# Patient Record
Sex: Male | Born: 1995 | Race: White | Hispanic: No | Marital: Single | Smoking: Never smoker
Health system: Southern US, Community
[De-identification: ages and names within clinical notes are randomized; demographics above are authoritative.]

## PROBLEM LIST (undated history)

## (undated) DIAGNOSIS — J302 Other seasonal allergic rhinitis: Secondary | ICD-10-CM

## (undated) HISTORY — DX: Other seasonal allergic rhinitis: J30.2

## (undated) HISTORY — PX: MOUTH SURGERY: SHX715

---
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
## 2015-01-08 ENCOUNTER — Encounter (HOSPITAL_COMMUNITY): Payer: Self-pay | Admitting: Emergency Medicine

## 2015-01-08 ENCOUNTER — Emergency Department (HOSPITAL_COMMUNITY)
Admission: EM | Admit: 2015-01-08 | Discharge: 2015-01-08 | Disposition: A | Discharge status: Home / Self Care | Payer: Medicaid Other | Attending: Emergency Medicine | Admitting: Emergency Medicine

## 2015-01-08 DIAGNOSIS — Z7951 Long term (current) use of inhaled steroids: Secondary | ICD-10-CM | POA: Insufficient documentation

## 2015-01-08 DIAGNOSIS — R197 Diarrhea, unspecified: Secondary | ICD-10-CM | POA: Insufficient documentation

## 2015-01-08 DIAGNOSIS — Z7982 Long term (current) use of aspirin: Secondary | ICD-10-CM | POA: Insufficient documentation

## 2015-01-08 DIAGNOSIS — R1084 Generalized abdominal pain: Secondary | ICD-10-CM | POA: Insufficient documentation

## 2015-01-08 DIAGNOSIS — R509 Fever, unspecified: Secondary | ICD-10-CM | POA: Insufficient documentation

## 2015-01-08 DIAGNOSIS — R110 Nausea: Secondary | ICD-10-CM | POA: Insufficient documentation

## 2015-01-08 LAB — CBC WITH DIFFERENTIAL/PLATELET
Basophils Absolute: 0 10*3/uL (ref 0.0–0.1)
Basophils Relative: 0 % (ref 0–1)
EOS ABS: 0 10*3/uL (ref 0.0–0.7)
Eosinophils Relative: 0 % (ref 0–5)
HEMATOCRIT: 43.5 % (ref 39.0–52.0)
HEMOGLOBIN: 14.8 g/dL (ref 13.0–17.0)
Lymphocytes Relative: 5 % — ABNORMAL LOW (ref 12–46)
Lymphs Abs: 0.5 10*3/uL — ABNORMAL LOW (ref 0.7–4.0)
MCH: 30.5 pg (ref 26.0–34.0)
MCHC: 34 g/dL (ref 30.0–36.0)
MCV: 89.5 fL (ref 78.0–100.0)
MONOS PCT: 10 % (ref 3–12)
Monocytes Absolute: 0.9 10*3/uL (ref 0.1–1.0)
NEUTROS PCT: 85 % — AB (ref 43–77)
Neutro Abs: 7.3 10*3/uL (ref 1.7–7.7)
Platelets: 214 10*3/uL (ref 150–400)
RBC: 4.86 MIL/uL (ref 4.22–5.81)
RDW: 12.7 % (ref 11.5–15.5)
WBC: 8.7 10*3/uL (ref 4.0–10.5)

## 2015-01-08 LAB — I-STAT CHEM 8, ED
BUN: 14 mg/dL (ref 6–20)
CHLORIDE: 105 mmol/L (ref 101–111)
Calcium, Ion: 1.12 mmol/L (ref 1.12–1.23)
Creatinine, Ser: 0.7 mg/dL (ref 0.61–1.24)
GLUCOSE: 102 mg/dL — AB (ref 65–99)
HCT: 47 % (ref 39.0–52.0)
Hemoglobin: 16 g/dL (ref 13.0–17.0)
POTASSIUM: 3.4 mmol/L — AB (ref 3.5–5.1)
Sodium: 138 mmol/L (ref 135–145)
TCO2: 20 mmol/L (ref 0–100)

## 2015-01-08 MED ORDER — SODIUM CHLORIDE 0.9 % IV BOLUS (SEPSIS)
1000.0000 mL | Freq: Once | INTRAVENOUS | Status: AC
  Administered 2015-01-08: 1000 mL via INTRAVENOUS

## 2015-01-08 MED ORDER — LOPERAMIDE HCL 2 MG PO CAPS: 2.0000 mg | ORAL_CAPSULE | Freq: Four times a day (QID) | ORAL | Status: AC | PRN

## 2015-01-08 MED ORDER — KETOROLAC TROMETHAMINE 30 MG/ML IJ SOLN
30.0000 mg | Freq: Once | INTRAMUSCULAR | Status: AC
  Administered 2015-01-08: 30 mg via INTRAVENOUS
  Filled 2015-01-08: qty 1

## 2015-01-08 MED ORDER — LOPERAMIDE HCL 2 MG PO CAPS
4.0000 mg | ORAL_CAPSULE | ORAL | Status: DC | PRN
  Administered 2015-01-08: 4 mg via ORAL
  Filled 2015-01-08: qty 2

## 2015-01-08 NOTE — ED Notes (Signed)
Bed: AO13 Expected date:  Expected time:  Means of arrival:  Comments: EMS 19 yo male left lower abdominal pain and chills x 1 day

## 2015-01-08 NOTE — ED Provider Notes (Signed)
Assumed care of patient from Earley Favor, NP, at change of shift.  Pt with diarrhea that began yesterday.  Only one episode in ED.  Pt receiving IVF and imodium.  May be d/c home after bolus.    6:56 AM Per nurse, pt is tolerating oral fluids, is asking for discharge home.  I have also spoken with the patient.  He has had no further diarrhea, states he is feeling better.  D/C with papers and prescription by NP Manus Rudd.   [place], PA-C 01/08/15 [phone]

## 2015-01-08 NOTE — ED Notes (Signed)
Pt arrived to the ED with a complaint of abdominal pain and diarrhea.  Pt states symptoms began earlier yesterday.  Pt states he has had 10-11 episodes of diarrhea.  Pt has been given 500 cc of fluid, and 4 mg of zofran by EMS.

## 2015-01-08 NOTE — ED Provider Notes (Signed)
CSN: [id number]     Arrival date & time 01/08/15  0007 History   First MD Initiated Contact with Patient 01/08/15 0012     Chief Complaint  Patient presents with  . Abdominal Pain  . Diarrhea     (Consider location/radiation/quality/duration/timing/severity/associated sxs/prior Treatment) HPI Comments: Patient states that at work last night he developed crampy abdominal pain followed by diarrhea and some nausea no vomiting .  He's had multiple episodes of diarrhea since that time .  He states he developed cold, chills and then fever .  States she's been urinating .  No known ill contacts.  He's never had anything like this before , has not taken any medication for  his symptoms   Patient is a 19 y.o. male presenting with abdominal pain and diarrhea. The history is provided by the patient.  Abdominal Pain Pain location:  Generalized Pain quality: cramping   Pain severity:  Moderate Onset quality:  Gradual Duration:  8 hours Timing:  Intermittent Progression:  Unchanged Chronicity:  New Relieved by:  Nothing Worsened by:  Nothing tried Ineffective treatments:  None tried Associated symptoms: chills, diarrhea, fever and nausea   Associated symptoms: no constipation, no cough, no dysuria, no shortness of breath, no sore throat and no vomiting   Diarrhea:    Quality:  Watery   Number of occurrences:  10   Severity:  Moderate   Duration:  8 hours   Timing:  Intermittent   Progression:  Unchanged Diarrhea Associated symptoms: abdominal pain, chills and fever   Associated symptoms: no vomiting     Past Medical History  Diagnosis Date  . Allergic rhinitis, seasonal 02/22/2011  . Seasonal allergies   . Astigmatism    Past Surgical History  Procedure Laterality Date  . Tonsillectomy  2010  . Mouth surgery     History reviewed. No pertinent family history. Social History  Substance Use Topics  . Smoking status: Never Smoker   . Smokeless tobacco: Never Used  . Alcohol Use:  No    Review of Systems  Constitutional: Positive for fever and chills.  HENT: Negative for sore throat.   Respiratory: Negative for cough and shortness of breath.   Gastrointestinal: Positive for nausea, abdominal pain and diarrhea. Negative for vomiting and constipation.  Genitourinary: Negative for dysuria and flank pain.  All other systems reviewed and are negative.     Allergies  Cranberry and Grapefruit concentrate  Home Medications   Prior to Admission medications   Medication Sig Start Date End Date Taking? Authorizing Provider  aspirin 325 MG tablet Take 325 mg by mouth daily as needed for mild pain.   Yes Historical Provider, MD  chlorpheniramine (CHLOR-TRIMETON) 4 MG tablet Take 4 mg by mouth 2 (two) times daily as needed for allergies.   Yes Historical Provider, MD  ibuprofen (ADVIL,MOTRIN) 200 MG tablet Take 400 mg by mouth every 6 (six) hours as needed for moderate pain.   Yes Historical Provider, MD  cephALEXin (KEFLEX) 500 MG capsule Take 1 capsule (500 mg total) by mouth 4 (four) times daily. Patient not taking: Reported on 07/28/2014 11/06/13   Mellody Drown, PA-C  cetirizine (ZYRTEC) 10 MG tablet Take 1 tablet (10 mg total) by mouth daily. 02/22/11 02/22/12  Faylene Kurtz, MD  fluticasone (FLONASE) 50 MCG/ACT nasal spray Place 2 sprays into the nose daily. 02/22/11 02/22/12  Faylene Kurtz, MD  HYDROcodone-acetaminophen (NORCO/VICODIN) 5-325 MG per tablet Take 1 tablet by mouth every 4 (four) hours as needed for  moderate pain or severe pain. Patient not taking: Reported on 07/28/2014 11/06/13   Mellody Drown, PA-C  loperamide (IMODIUM) 2 MG capsule Take 1 capsule (2 mg total) by mouth 4 (four) times daily as needed for diarrhea or loose stools. 01/08/15   Earley Favor, NP  ondansetron (ZOFRAN ODT) 8 MG disintegrating tablet 8mg  ODT q4 hours prn nausea Patient not taking: Reported on 10/14/2014 09/13/14   Dahlia Client Muthersbaugh, PA-C   BP 110/54 mmHg  Pulse 89  Temp(Src) 99.6 F  (37.6 C) (Oral)  Resp 18  Ht 5\' 10"  (1.778 m)  Wt 160 lb (72.576 kg)  BMI 22.96 kg/m2  SpO2 98% Physical Exam  Constitutional: He is oriented to person, place, and time. He appears well-developed and well-nourished.  HENT:  Head: Normocephalic.  Eyes: Pupils are equal, round, and reactive to light.  Neck: Normal range of motion.  Cardiovascular: Normal rate and regular rhythm.   Pulmonary/Chest: Effort normal and breath sounds normal.  Abdominal: Soft. Bowel sounds are normal. He exhibits no distension. There is no tenderness.  Musculoskeletal: Normal range of motion.  Neurological: He is alert and oriented to person, place, and time.  Skin: Skin is warm. There is pallor.  Nursing note and vitals reviewed.   ED Course  Procedures (including critical care time) Labs Review Labs Reviewed  CBC WITH DIFFERENTIAL/PLATELET - Abnormal; Notable for the following:    Neutrophils Relative % 85 (*)    Lymphocytes Relative 5 (*)    Lymphs Abs 0.5 (*)    All other components within normal limits  I-STAT CHEM 8, ED - Abnormal; Notable for the following:    Potassium 3.4 (*)    Glucose, Bld 102 (*)    All other components within normal limits    Imaging Review No results found. I, Leslie Jester K, personally reviewed and evaluated these images and lab results as part of my medical decision-making.   EKG Interpretation None     Patient has had one bowel movement since arrival in the emergency room and states he still does not feel any better.  He is feeling very weak additional IV fluids, Imodium, and Toradol MDM   Final diagnoses:  Diarrhea        Earley Favor, NP 01/08/15 0532  Derwood Kaplan, MD 01/08/15 2208

## 2015-01-08 NOTE — Discharge Instructions (Signed)
Been given a prescription for Imodium to help control your loose stools.  You've also been given a diet to follow.  Try to rest, drink plenty of fluids, follow the dietary outline take Imodium as directed.  Follow-up with your primary care physician

## 2015-01-09 ENCOUNTER — Emergency Department (HOSPITAL_COMMUNITY)
Admission: EM | Admit: 2015-01-09 | Discharge: 2015-01-09 | Disposition: A | Discharge status: Home / Self Care | Payer: Medicaid Other | Attending: Emergency Medicine | Admitting: Emergency Medicine

## 2015-01-09 ENCOUNTER — Emergency Department (HOSPITAL_COMMUNITY): Payer: Medicaid Other

## 2015-01-09 ENCOUNTER — Encounter (HOSPITAL_COMMUNITY): Payer: Self-pay

## 2015-01-09 DIAGNOSIS — Z7982 Long term (current) use of aspirin: Secondary | ICD-10-CM | POA: Insufficient documentation

## 2015-01-09 DIAGNOSIS — R197 Diarrhea, unspecified: Secondary | ICD-10-CM | POA: Insufficient documentation

## 2015-01-09 DIAGNOSIS — R509 Fever, unspecified: Secondary | ICD-10-CM | POA: Insufficient documentation

## 2015-01-09 DIAGNOSIS — R110 Nausea: Secondary | ICD-10-CM | POA: Insufficient documentation

## 2015-01-09 DIAGNOSIS — Z8709 Personal history of other diseases of the respiratory system: Secondary | ICD-10-CM | POA: Insufficient documentation

## 2015-01-09 DIAGNOSIS — Z8669 Personal history of other diseases of the nervous system and sense organs: Secondary | ICD-10-CM | POA: Insufficient documentation

## 2015-01-09 DIAGNOSIS — R1013 Epigastric pain: Secondary | ICD-10-CM

## 2015-01-09 MED ORDER — ONDANSETRON HCL 4 MG PO TABS: 4.0000 mg | ORAL_TABLET | Freq: Four times a day (QID) | ORAL | Status: AC

## 2015-01-09 MED ORDER — ONDANSETRON HCL 4 MG/2ML IJ SOLN
4.0000 mg | Freq: Once | INTRAMUSCULAR | Status: AC
  Administered 2015-01-09: 4 mg via INTRAVENOUS
  Filled 2015-01-09: qty 2

## 2015-01-09 MED ORDER — SODIUM CHLORIDE 0.9 % IV BOLUS (SEPSIS)
1000.0000 mL | Freq: Once | INTRAVENOUS | Status: AC
  Administered 2015-01-09: 1000 mL via INTRAVENOUS

## 2015-01-09 MED ORDER — TRAMADOL HCL 50 MG PO TABS: 50.0000 mg | ORAL_TABLET | Freq: Four times a day (QID) | ORAL | Status: AC | PRN

## 2015-01-09 MED ORDER — MORPHINE SULFATE 4 MG/ML IJ SOLN
6.0000 mg | Freq: Once | INTRAMUSCULAR | Status: AC
  Administered 2015-01-09: 6 mg via INTRAVENOUS
  Filled 2015-01-09: qty 2

## 2015-01-09 MED ORDER — ACETAMINOPHEN 325 MG PO TABS
650.0000 mg | ORAL_TABLET | Freq: Once | ORAL | Status: AC
Start: 2015-01-09 — End: 2015-01-09
  Administered 2015-01-09: 650 mg via ORAL
  Filled 2015-01-09: qty 2

## 2015-01-09 NOTE — ED Notes (Signed)
MD at bedside. 

## 2015-01-09 NOTE — ED Notes (Signed)
Bed: [facility] Expected date:  Expected time:  Means of arrival:  Comments: EMS- 19yo M, abdominal pain

## 2015-01-09 NOTE — Progress Notes (Signed)
Pt's girlfriend returned a call stating that pt still with abdominal pain.  CM could hear pt in background.  CM requested to speak with pt He reports he has taken zofran and Ultram but only sips of fluids and has not complied to liquid diet progressing to brat diet  Encouraged to follow discharge instructions clear liquids and gas releasing OTC and return to ED if no relief after consulting with ED staff who saw pt while at [facility]

## 2015-01-09 NOTE — ED Notes (Signed)
Asked patient if he could urinate for a sample and he said he could not.

## 2015-01-09 NOTE — Discharge Instructions (Signed)

## 2015-01-09 NOTE — ED Notes (Signed)
Per [facility]- Pt onset Saturday. Seen and TX at [facility]. Released- given for diarrhea however has not had filled yet. Pt felt relief and increased his diet to fatty and greasy food. Pt here for presentting complaint of generalized BUQ pain and nausea. Denies any other complaints

## 2015-01-16 NOTE — ED Provider Notes (Signed)
CSN: [id number]     Arrival date & time 01/09/15  0729 History   First MD Initiated Contact with Patient 01/09/15 [phone]     Chief Complaint  Patient presents with  . Abdominal Pain  . Nausea     (Consider location/radiation/quality/duration/timing/severity/associated sxs/prior Treatment) HPI   19yM with abdominal pain. Onset two nights ago. Persistent since. Associated with diarrhea. Pain is crampy and diffuse. Seen in ED yesterday for the same. Has since developed n/v. Subjective fever. No blood in stool or emesis. No urinary complaints. No sick contacts.   Past Medical History  Diagnosis Date  . Allergic rhinitis, seasonal 02/22/2011  . Seasonal allergies   . Astigmatism    Past Surgical History  Procedure Laterality Date  . Tonsillectomy  2010  . Mouth surgery     No family history on file. Social History  Substance Use Topics  . Smoking status: Never Smoker   . Smokeless tobacco: Never Used  . Alcohol Use: No    Review of Systems  All systems reviewed and negative, other than as noted in HPI.   Allergies  Cranberry and Grapefruit concentrate  Home Medications   Prior to Admission medications   Medication Sig Start Date End Date Taking? Authorizing Provider  aspirin 325 MG tablet Take 325 mg by mouth daily as needed for mild pain.   Yes Historical Provider, MD  chlorpheniramine (CHLOR-TRIMETON) 4 MG tablet Take 4 mg by mouth 2 (two) times daily as needed for allergies.   Yes Historical Provider, MD  loperamide (IMODIUM) 2 MG capsule Take 1 capsule (2 mg total) by mouth 4 (four) times daily as needed for diarrhea or loose stools. Patient not taking: Reported on 01/09/2015 01/08/15   Earley Favor, NP  ondansetron [facility] ODT) 8 MG disintegrating tablet 8mg  ODT q4 hours prn nausea Patient not taking: Reported on 10/14/2014 09/13/14   Dahlia Client Muthersbaugh, PA-C  ondansetron (ZOFRAN) 4 MG tablet Take 1 tablet (4 mg total) by mouth every 6 (six) hours. 01/09/15   Raeford Razor, MD  traMADol (ULTRAM) 50 MG tablet Take 1 tablet (50 mg total) by mouth every 6 (six) hours as needed. 01/09/15   Raeford Razor, MD   BP 121/67 mmHg  Pulse 89  Temp(Src) 99.9 F (37.7 C) (Oral)  Resp 15  SpO2 96% Physical Exam  Constitutional: He appears well-developed and well-nourished. No distress.  HENT:  Head: Normocephalic and atraumatic.  Eyes: Conjunctivae are normal. Right eye exhibits no discharge. Left eye exhibits no discharge.  Neck: Neck supple.  Cardiovascular: Normal rate, regular rhythm and normal heart sounds.  Exam reveals no gallop and no friction rub.   No murmur heard. Pulmonary/Chest: Effort normal and breath sounds normal. No respiratory distress.  Abdominal: Soft. He exhibits no distension. There is tenderness. There is guarding.  Musculoskeletal: He exhibits no edema or tenderness.  Neurological: He is alert.  Skin: Skin is warm and dry.  Psychiatric: He has a normal mood and affect. His behavior is normal. Thought content normal.  Nursing note and vitals reviewed.   ED Course  Procedures (including critical care time) Labs Review Labs Reviewed - No data to display  Imaging Review No results found. I have personally reviewed and evaluated these images and lab results as part of my medical decision-making.   EKG Interpretation None      MDM   Final diagnoses:  Fever, unspecified fever cause  Epigastric pain    19yM with fever and abdominal pain. Abdominal exam is  benign. Viral illness? HD stable. Nontoxic. Low suspicion for sbi or other emergent process.     Raeford Razor, MD 01/18/15 1434

## 2015-11-28 IMAGING — CT CT HEAD W/O CM
2 series · 16 of 30 positions shown, 18 images · non-contrast
Comparison: None.

CLINICAL DATA: Acute onset of vomiting. Found on floor.
Lightheadedness. Initial encounter.

EXAM:
CT HEAD WITHOUT CONTRAST
TECHNIQUE: Contiguous axial images were obtained from the base of the skull
through the vertex without intravenous contrast.

[Series 201: head w/o, idose (1) · axial · non-contrast · 0.49mm/px · z∈[+121,+226]mm · 8 of 29 slices shown, 10 images]
[im 4/29  brain]
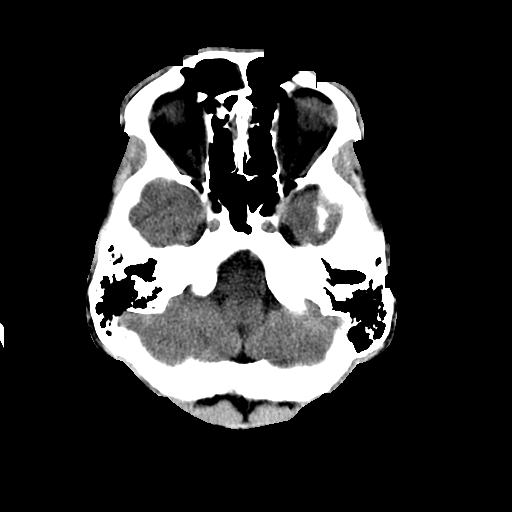
[im 4/29  bone]
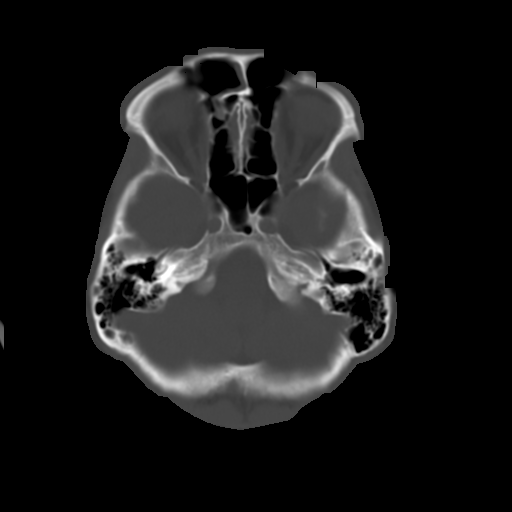
[im 7/29  brain]
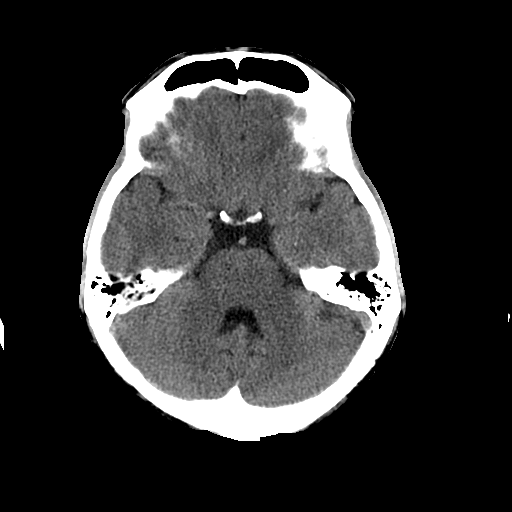
[im 10/29  brain]
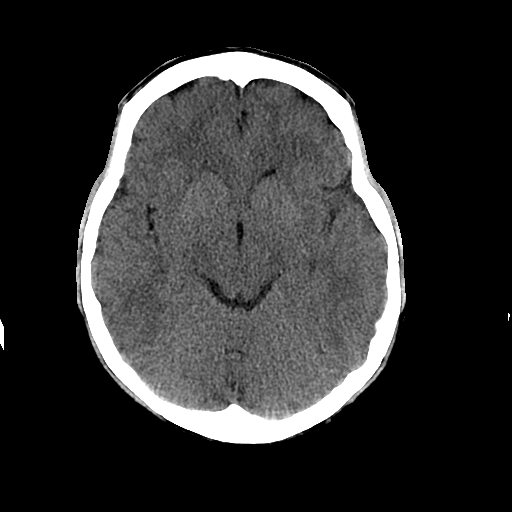
[im 13/29  brain]
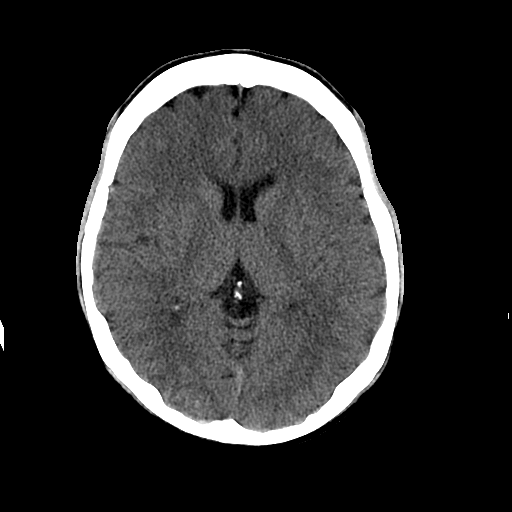
[im 16/29  brain]
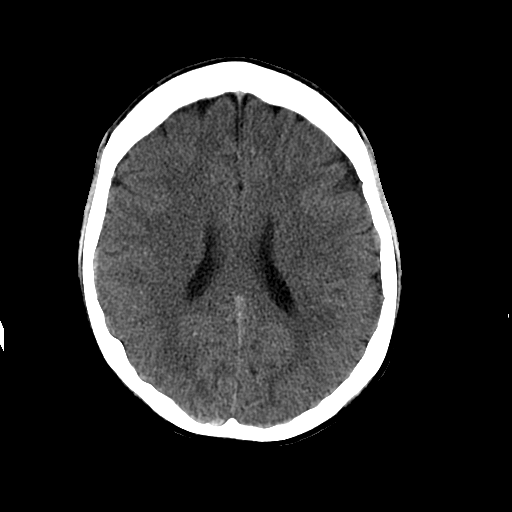
[im 16/29  bone]
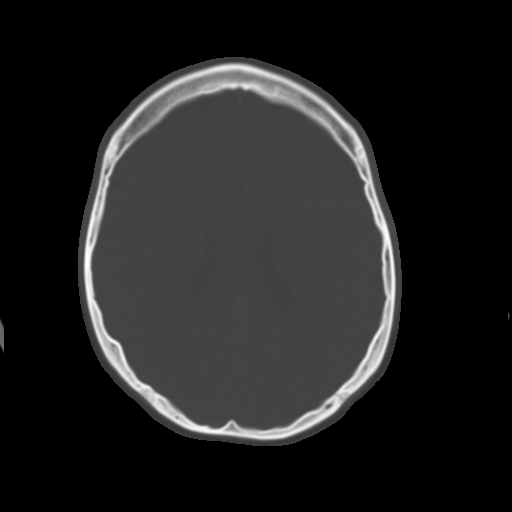
[im 19/29  brain]
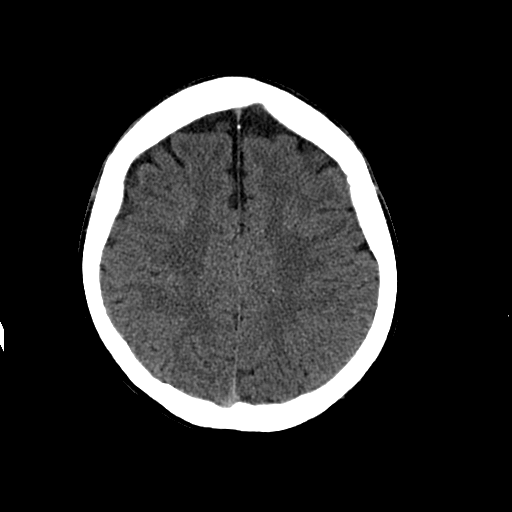
[im 22/29  brain]
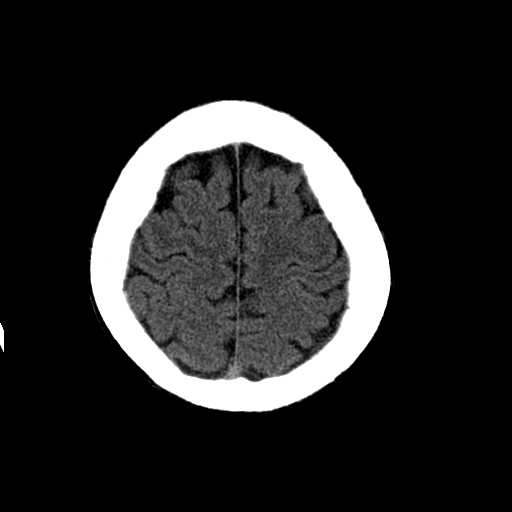
[im 25/29  brain]
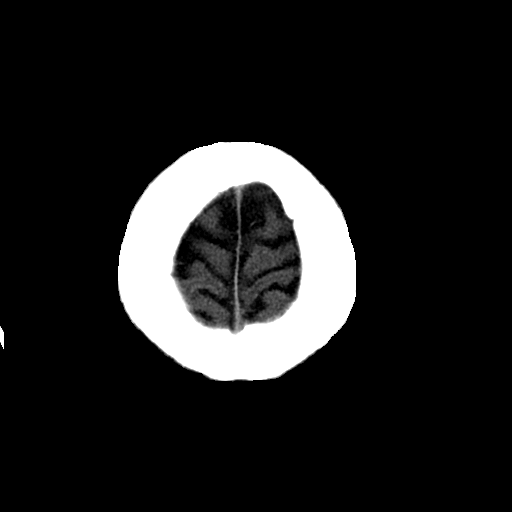

[Series 202: head w/o bone, idose (1) · axial · non-contrast · 0.49mm/px · z∈[+120,+233]mm · 8 of 58 slices shown]
[im 7/58  bone]
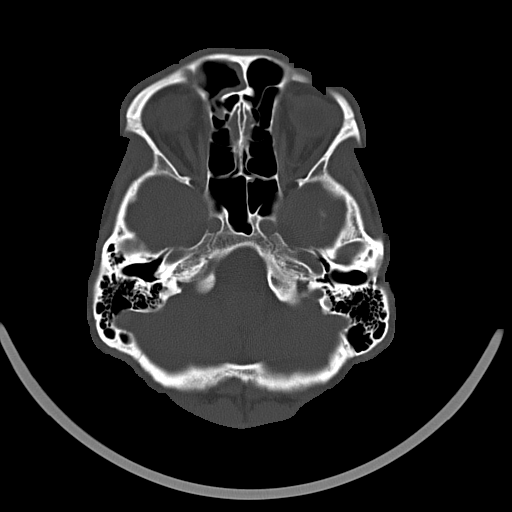
[im 13/58  bone]
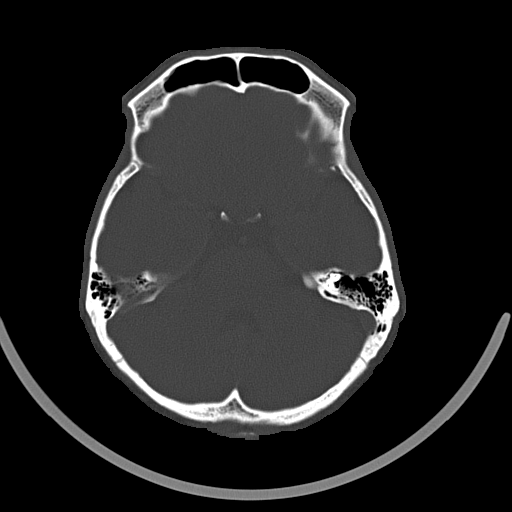
[im 19/58  bone]
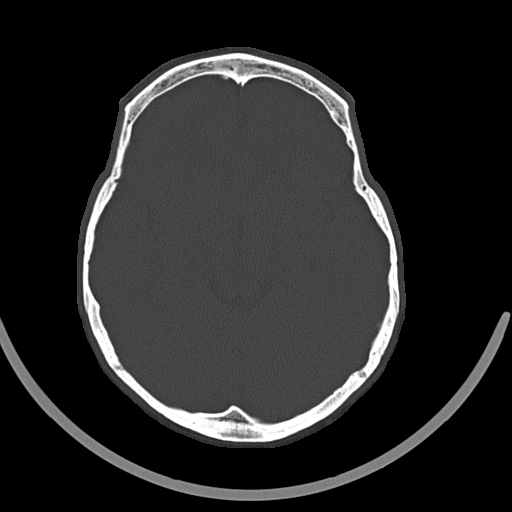
[im 25/58  bone]
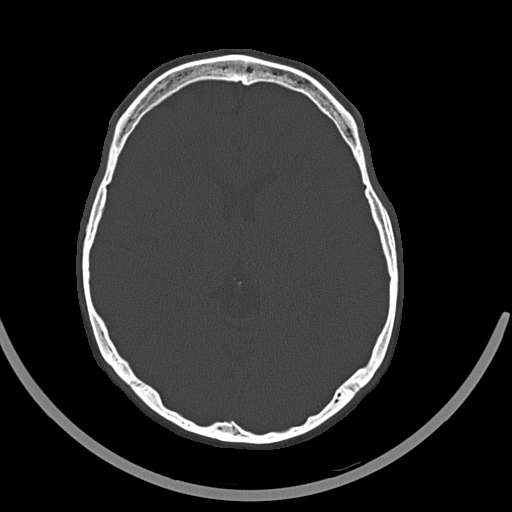
[im 34/58  bone]
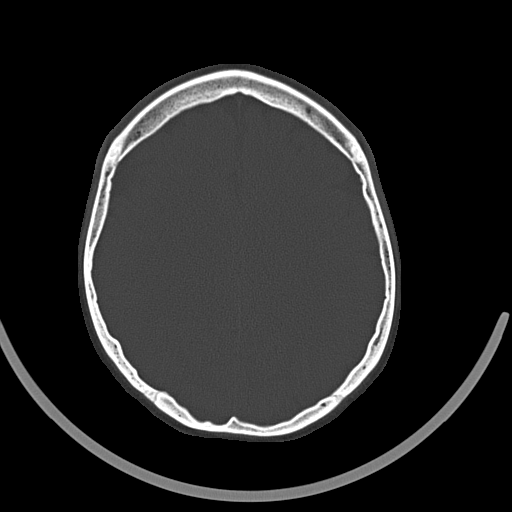
[im 40/58  bone]
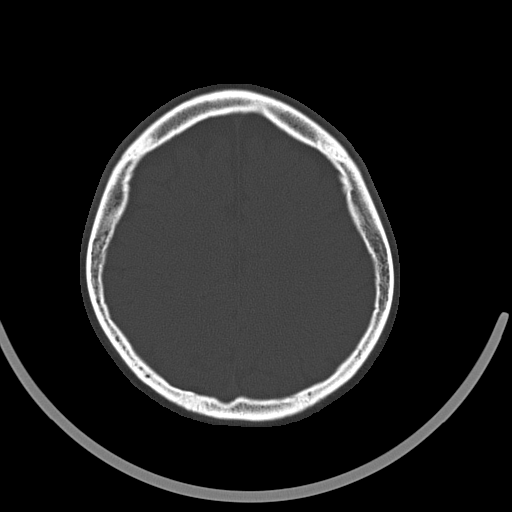
[im 46/58  bone]
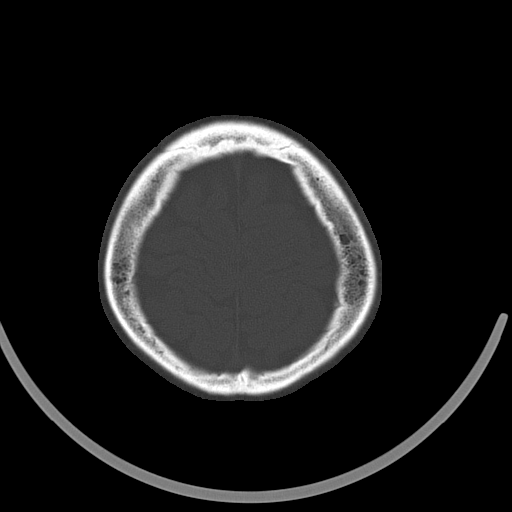
[im 52/58  bone]
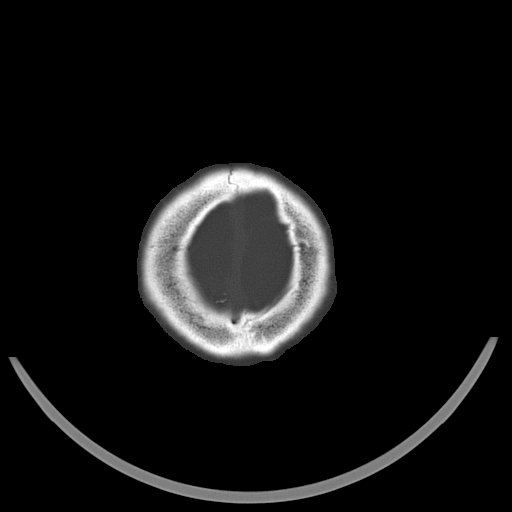

[16 of 30 positions shown; findings below may reference images not displayed]

FINDINGS: There is no evidence of acute infarction, mass lesion, or intra- or
extra-axial hemorrhage on CT.

The posterior fossa, including the cerebellum, brainstem and fourth
ventricle, is within normal limits. The third and lateral
ventricles, and basal ganglia are unremarkable in appearance. The
cerebral hemispheres are symmetric in appearance, with normal
gray-white differentiation. No mass effect or midline shift is seen.

There is no evidence of fracture; visualized osseous structures are
unremarkable in appearance. The visualized portions of the orbits
are within normal limits. Mucosal thickening is noted at the right
side of the sphenoid sinus. The remaining paranasal sinuses and
mastoid air cells are well-aerated. No significant soft tissue
abnormalities are seen.
IMPRESSION: 1. No acute intracranial pathology seen on CT.
2. Mucosal thickening at the right side of the sphenoid sinus.
